# Patient Record
Sex: Male | Born: 1935 | Race: White | Hispanic: No | Marital: Single | Smoking: Former smoker
Health system: Southern US, Community
[De-identification: ages and names within clinical notes are randomized; demographics above are authoritative.]

---

## 2018-08-07 ENCOUNTER — Emergency Department (HOSPITAL_COMMUNITY)
Admission: EM | Admit: 2018-08-07 | Discharge: 2018-08-07 | Disposition: A | Discharge status: Home / Self Care | Payer: Medicare Other | Attending: Emergency Medicine | Admitting: Emergency Medicine

## 2018-08-07 DIAGNOSIS — Z7982 Long term (current) use of aspirin: Secondary | ICD-10-CM | POA: Diagnosis not present

## 2018-08-07 DIAGNOSIS — Z79899 Other long term (current) drug therapy: Secondary | ICD-10-CM | POA: Diagnosis not present

## 2018-08-07 DIAGNOSIS — R040 Epistaxis: Secondary | ICD-10-CM

## 2018-08-07 LAB — CBC
HCT: 41 % (ref 39.0–52.0)
Hemoglobin: 13.1 g/dL (ref 13.0–17.0)
MCH: 31.5 pg (ref 26.0–34.0)
MCHC: 32 g/dL (ref 30.0–36.0)
MCV: 98.6 fL (ref 80.0–100.0)
Platelets: 202 10*3/uL (ref 150–400)
RBC: 4.16 MIL/uL — ABNORMAL LOW (ref 4.22–5.81)
RDW: 13.9 % (ref 11.5–15.5)
WBC: 7.2 10*3/uL (ref 4.0–10.5)
nRBC: 0 % (ref 0.0–0.2)

## 2018-08-07 MED ORDER — OXYMETAZOLINE HCL 0.05 % NA SOLN
1.0000 | Freq: Once | NASAL | Status: AC
  Administered 2018-08-07: 1 via NASAL
  Filled 2018-08-07: qty 15

## 2018-08-07 NOTE — ED Notes (Signed)
Patient verbalizes understanding of discharge instructions. Opportunity for questioning and answers were provided. Armband removed by staff, pt discharged from ED. Ambulated out into lobby  

## 2018-08-07 NOTE — Discharge Instructions (Addendum)
Thank you for allowing me to care for you today in the Emergency Department.   Start using your saline nose spray, 2 sprays in each nostril 2 times daily.  Try to limit blowing your nose or sneezing. Take your cetirizine at home if this helps to decrease sneezing for you.  If you develop another nosebleed, would lean forward, and pinch the nostrils together for at least 10 minutes.  If you continue to have bleeding, insert 1 spray of Afrin into the nostril that is bleeding and then continue to apply pressure for at least 10 minutes.  If you are unable to get the bleeding to stop, return to the emergency department for evaluation, particularly if you develop dizziness, lightheadedness, or if your passing large blood clots.  If you continue to have several episodes of bleeding at home that you are able to get to stop on your own, follow-up with Dr. Shea Evans in the office.

## 2018-08-07 NOTE — ED Provider Notes (Signed)
[facility] Provider Note   CSN: [id number] Arrival date & time: 08/07/18  0501     History   Chief Complaint Chief Complaint  Patient presents with  . Epistaxis    HPI Don Drake is a 85 y.o. male with a history of A. fib on Pradaxa, hypertension, and PE who presents to the emergency department with a chief complaint of epistaxis.  The patient endorses 3 episodes of epistaxis in the last 24 hours. He underwent septoplasty and ITR on by Dr. Donnalee CurryMark Emery on 07/15/18.  Had postoperative follow-up appointment on 07/30/2018 and was clear to follow-up as needed.  He reports the first episode happened yesterday afternoon approximately 1 to 2 hours after taking his afternoon dose of Pradaxa.  He squirted Afrin and placed a cotton ball into the right nostril.  The bleeding stopped shortly thereafter.  He states he developed a second episode last night several hours after taking his nighttime dose of Pradaxa.  States the bleeding stopped before he went to bed.  He reports a third episode that occurred when he awoke this morning.  He attempted to use Afrin, but was unable to get the bleeding to stop.  He denies passing any clots.  The last 2 episodes have been bilateral.  No aggravating or alleviating factors.  No dizziness or lightheadedness.  He reports increased sneezing for 2 days prior to the onset of epistaxis.  States he uses a humidifier in his home.  He reports that he has stopped using the saline nose spray provided to him by ENT.   The history is provided by the patient. No language interpreter was used.    No past medical history on file.  There are no active problems to display for this patient.      Home Medications    Prior to Admission medications   Medication Sig Start Date End Date Taking? Authorizing Provider  aspirin 81 MG chewable tablet Chew 81 mg by mouth every Monday, Wednesday, and Friday.   Yes [provider]  B  Complex Vitamins (VITAMIN B-COMPLEX) TABS Take 1 tablet by mouth daily.   Yes [provider]  Calcium Carbonate-Vitamin D (CALCIUM-D PO) Take 1 tablet by mouth daily.    Yes [provider]  clindamycin (CLEOCIN T) 1 % external solution Apply 1 application topically 2 (two) times daily.  03/08/15  Yes [provider]  clobetasol (TEMOVATE) 0.05 % external solution Apply 1 application topically 2 (two) times daily.  06/04/17  Yes [provider]  Coenzyme Q10-Fish Oil-Vit E (CO-Q 10 OMEGA-3 FISH OIL) CAPS Take 1 capsule by mouth 2 (two) times daily.   Yes [provider]  dabigatran (PRADAXA) 150 MG CAPS capsule Take 150 mg by mouth every 12 (twelve) hours.  02/13/17  Yes [provider]  escitalopram (LEXAPRO) 10 MG tablet Take 5 mg by mouth daily.  04/06/17  Yes [provider]  ipratropium (ATROVENT) 0.03 % nasal spray Place 1-2 sprays into the nose daily.  05/24/18  Yes [provider]  losartan (COZAAR) 25 MG tablet Take 12.5 mg by mouth daily.  01/12/17  Yes [provider]  Multiple Vitamins-Minerals (OCUVITE PO) Take 1 tablet by mouth every other day.   Yes [provider]  prednisoLONE acetate (PRED FORTE) 1 % ophthalmic suspension Place 1 drop into the left eye every morning.  06/10/15  Yes [provider]  traZODone (DESYREL) 50 MG tablet Take 25 mg by mouth  every morning.  02/28/17  Yes [provider]  UNABLE TO FIND Take 1 tablet by mouth daily. NEURO 24   Yes [provider]  vitamin C (ASCORBIC ACID) 500 MG tablet Take 500 mg by mouth daily.    Yes [provider]  Zinc Sulfate (ZINC 15 PO) Take 15 mg by mouth daily.    Yes [provider]    Family History No family history on file.  Social History Social History   Tobacco Use  . Smoking status: Not on file  Substance Use Topics  . Alcohol use: Not on file  . Drug use: Not on file      Allergies   Statins   Review of Systems Review of Systems  Constitutional: Negative for appetite change and fever.  HENT: Positive for nosebleeds and sneezing. Negative for congestion, rhinorrhea, sinus pressure, sinus pain, sore throat and voice change.   Respiratory: Negative for cough, shortness of breath and wheezing.   Cardiovascular: Negative for chest pain.  Gastrointestinal: Negative for abdominal pain, constipation, diarrhea, nausea and vomiting.  Genitourinary: Negative for dysuria, hematuria and urgency.  Musculoskeletal: Negative for back pain, myalgias, neck pain and neck stiffness.  Skin: Negative for color change and rash.  Allergic/Immunologic: Negative for immunocompromised state.  Neurological: Negative for dizziness, syncope, weakness, numbness and headaches.  Psychiatric/Behavioral: Negative for confusion.   Physical Exam Updated Vital Signs BP (!) 152/95   Pulse 67   Temp 97.7 F (36.5 C) (Oral)   Resp 16   SpO2 97%   Physical Exam Vitals signs and nursing note reviewed.  Constitutional:      General: He is not in acute distress.    Appearance: He is well-developed. He is not ill-appearing.  HENT:     Head: Normocephalic.     Right Ear: Tympanic membrane normal.     Nose: No septal deviation or laceration.     Right Nostril: No foreign body, septal hematoma or occlusion.     Left Nostril: No foreign body, septal hematoma or occlusion.     Comments: Minimal amount of dried blood noted in the bilateral nares, over the mucosa of the lateral aspects of the nose.  No active bleeding.  No bleeding near the septum.  Septum is unremarkable.     Mouth/Throat:     Comments: Minimal amount of dried blood noted in the posterior oropharynx. Eyes:     Conjunctiva/sclera: Conjunctivae normal.  Neck:     Musculoskeletal: Normal range of motion and neck supple.  Cardiovascular:     Rate and Rhythm: Normal rate and regular rhythm.     Heart sounds: No murmur.  No friction rub. No gallop.   Pulmonary:     Effort: Pulmonary effort is normal. No respiratory distress.     Breath sounds: Normal breath sounds. No stridor. No wheezing, rhonchi or rales.  Chest:     Chest wall: No tenderness.  Abdominal:     General: There is no distension.     Palpations: Abdomen is soft.  Skin:    General: Skin is warm and dry.  Neurological:     Mental Status: He is alert.  Psychiatric:        Behavior: Behavior normal.      ED Treatments / Results  Labs (all labs ordered are listed, but only abnormal results are displayed) Labs Reviewed  CBC - Abnormal; Notable for the following components:      Result Value   RBC 4.16 (*)  All other components within normal limits    EKG None  Radiology No results found.  Procedures Procedures (including critical care time)  Medications Ordered in ED Medications  oxymetazoline (AFRIN) 0.05 % nasal spray 1 spray (1 spray Each Nare Given 08/07/18 0522)     Initial Impression / Assessment and Plan / ED Course  I have reviewed the triage vital signs and the nursing notes.  Pertinent labs & imaging results that were available during my care of the patient were reviewed by me and considered in my medical decision making (see chart for details).     85 year old male with a history of A. fib on Pradaxa, hypertension, and PE presents to the emergency department with a chief complaint of epistaxis.  He has had 3 episodes of epistaxis prior to arrival in the ED.  The patient was discussed and evaluated by Dr. Clayborne Dana, attending physician.  He was treated with Afrin in the ER and bleeding has subsided.  No repeat bleeding episodes for the last 3 hours during observation.  Hemoglobin is 13.1.  He denies dizziness or lightheadedness.  He reports increased sneezing over the last 2 days prior to the onset of the first episode.  He has also stopped using his saline nose spray.  Encourage the patient to restart saline nose  spray.  Instructions on epistaxis control at home have been given.  Encourage the patient that if he has recurrent episodes of epistaxis that he is able to stop at home that he should follow-up with ENT.  Reports that if he has another episode of bleeding that his he is unable to stop after applying pressure and using Afrin that he should return to the ER.  Strict return precautions given.  He is hemodynamically stable in no acute distress.  He is safe for discharge home with outpatient follow-up at this time.  Final Clinical Impressions(s) / ED Diagnoses   Final diagnoses:  Epistaxis    ED Discharge Orders    None       Barkley Boards, PA-C 08/07/18 1333    Mesner, Barbara Cower, MD 08/08/18 1816

## 2018-08-07 NOTE — ED Triage Notes (Signed)
Pt states he had nasal surgery 2 weeks ago from Monday. Patient woke up with blood pouring from his nose. Pt tried to stop the bleeding with affrin, with no relief. Patient is on a blood thinner.

## 2018-10-22 ENCOUNTER — Encounter: Payer: Self-pay | Admitting: Vascular Surgery

## 2018-10-22 ENCOUNTER — Other Ambulatory Visit: Payer: Self-pay

## 2018-10-22 ENCOUNTER — Ambulatory Visit (INDEPENDENT_AMBULATORY_CARE_PROVIDER_SITE_OTHER): Payer: Medicare Other | Admitting: Vascular Surgery

## 2018-10-22 ENCOUNTER — Ambulatory Visit (HOSPITAL_COMMUNITY)
Admission: RE | Admit: 2018-10-22 | Discharge: 2018-10-22 | Disposition: A | Discharge status: Home / Self Care | Payer: Medicare Other | Source: Ambulatory Visit | Attending: Vascular Surgery | Admitting: Vascular Surgery

## 2018-10-22 VITALS — BP 133/79 | HR 70 | Temp 97.3°F | Resp 20 | Ht 72.0 in | Wt 190.9 lb

## 2018-10-22 DIAGNOSIS — I739 Peripheral vascular disease, unspecified: Secondary | ICD-10-CM | POA: Diagnosis present

## 2018-10-22 NOTE — Progress Notes (Signed)
[facility]ist of [facility]  Patient name: Don Drake MRN: [id number] DOB: 16-Jan-1933 Sex: male  REASON FOR CONSULT: Evaluation lower extremity arterial insufficiency  HPI: Don Drake is a 85 y.o. male, who is here today for evaluation of lower extremity arterial insufficiency.  He is very active 85 year old gentleman who notes discomfort in his calves and feet with exercise.  He does exercise with stationary bike and elliptical equipment 4 days a week.  He did a great deal of sports and running in his earlier years.  He tries to be proactive in his health.  He was found to have no palpable pedal pulses and is seen today for further discussion.  Does have a prior history of coronary intervention with stenting.  Has a history of pacemaker and also has a history of pulmonary embolus in the past.  No history of lower extremity tissue loss or rest pain.  History reviewed. No pertinent past medical history.  History reviewed. No pertinent family history.  SOCIAL HISTORY: Social History   Socioeconomic History  . Marital status: Single    Spouse name: Not on file  . Number of children: Not on file  . Years of education: Not on file  . Highest education level: Not on file  Occupational History  . Not on file  Social Needs  . Financial resource strain: Not on file  . Food insecurity:    Worry: Not on file    Inability: Not on file  . Transportation needs:    Medical: Not on file    Non-medical: Not on file  Tobacco Use  . Smoking status: Former Games developer  . Smokeless tobacco: Never Used  Substance and Sexual Activity  . Alcohol use: Yes    Alcohol/week: 1.0 standard drinks    Types: 1 Glasses of wine per week  . Drug use: Never  . Sexual activity: Not on file  Lifestyle  . Physical activity:    Days per week: Not on file    Minutes per session: Not on file  . Stress: Not on file  Relationships  . Social connections:   Talks on phone: Not on file    Gets together: Not on file    Attends religious service: Not on file    Active member of club or organization: Not on file    Attends meetings of clubs or organizations: Not on file    Relationship status: Not on file  . Intimate partner violence:    Fear of current or ex partner: Not on file    Emotionally abused: Not on file    Physically abused: Not on file    Forced sexual activity: Not on file  Other Topics Concern  . Not on file  Social History Narrative  . Not on file    Allergies  Allergen Reactions  . Statins Other (See Comments)    Other reaction(s): Myalgias (intolerance) Muscle pain      Current Outpatient Medications  Medication Sig Dispense Refill  . aspirin 81 MG chewable tablet Chew 81 mg by mouth every Monday, Wednesday, and Friday.    . B Complex Vitamins (VITAMIN B-COMPLEX) TABS Take 1 tablet by mouth daily.    . Calcium Carbonate-Vitamin D (CALCIUM-D PO) Take 1 tablet by mouth daily.     . clindamycin (CLEOCIN T) 1 % external solution Apply 1 application topically 2 (two) times daily.     . clobetasol (TEMOVATE) 0.05 % external solution Apply 1 application topically 2 (  two) times daily.     . Coenzyme Q10-Fish Oil-Vit E (CO-Q 10 OMEGA-3 FISH OIL) CAPS Take 1 capsule by mouth 2 (two) times daily.    . dabigatran (PRADAXA) 150 MG CAPS capsule Take 150 mg by mouth every 12 (twelve) hours.     Marland Kitchen escitalopram (LEXAPRO) 10 MG tablet Take 5 mg by mouth daily.     Marland Kitchen ipratropium (ATROVENT) 0.03 % nasal spray Place 1-2 sprays into the nose daily.     Marland Kitchen losartan (COZAAR) 25 MG tablet Take 12.5 mg by mouth daily.     . Multiple Vitamins-Minerals (OCUVITE PO) Take 1 tablet by mouth every other day.    . prednisoLONE acetate (PRED FORTE) 1 % ophthalmic suspension Place 1 drop into the left eye every morning.     . traZODone (DESYREL) 50 MG tablet Take 25 mg by mouth every morning.     Marland Kitchen UNABLE TO FIND Take 1 tablet by mouth daily. NEURO 24     . vitamin C (ASCORBIC ACID) 500 MG tablet Take 500 mg by mouth daily.     . Zinc Sulfate (ZINC 15 PO) Take 15 mg by mouth daily.      No current facility-administered medications for this visit.     REVIEW OF SYSTEMS:  [X]  denotes positive finding, [ ]  denotes negative finding Cardiac  Comments:  Chest pain or chest pressure:    Shortness of breath upon exertion: x   Short of breath when lying flat:    Irregular heart rhythm:        Vascular    Pain in calf, thigh, or hip brought on by ambulation: x   Pain in feet at night that wakes you up from your sleep:     Blood clot in your veins:    Leg swelling:  x       Pulmonary    Oxygen at home:    Productive cough:     Wheezing:         Neurologic    Sudden weakness in arms or legs:     Sudden numbness in arms or legs:     Sudden onset of difficulty speaking or slurred speech:    Temporary loss of vision in one eye:     Problems with dizziness:         Gastrointestinal    Blood in stool:     Vomited blood:         Genitourinary    Burning when urinating:     Blood in urine:        Psychiatric    Major depression:         Hematologic    Bleeding problems:    Problems with blood clotting too easily:        Skin    Rashes or ulcers: x       Constitutional    Fever or chills:      PHYSICAL EXAM: Vitals:   10/22/18 1007  BP: 133/79  Pulse: 70  Resp: 20  Temp: (!) 97.3 F (36.3 C)  SpO2: 97%  Weight: 190 lb 14.4 oz (86.6 kg)  Height: 6' (1.829 m)    GENERAL: The patient is a well-nourished male, in no acute distress. The vital signs are documented above. CARDIOVASCULAR: Carotid arteries without bruits bilaterally.  2+ radial pulses bilaterally.  2+ femoral and 2+ popliteal pulses bilaterally.  I do not palpate pedal pulses. PULMONARY: There is good air exchange  ABDOMEN: Soft  and non-tender  MUSCULOSKELETAL: There are no major deformities or cyanosis. NEUROLOGIC: No focal weakness or paresthesias are  detected. SKIN: There are no ulcers or rashes noted. PSYCHIATRIC: The patient has a normal affect.  DATA:  Noninvasive studies reveal ankle arm index of 0.91 on the right and 0.95 on the left with biphasic waveforms  MEDICAL ISSUES: I discussed these findings at length with the patient.  He does have some mild tibial disease but no significant arterial insufficiency.  He underwent an exercise study in our noninvasive lab and had normal response to exercise with an increase in his lower extremity pressures.  I explained that this essentially rules out any significant lower extremity arterial insufficiency.  He was relieved with this discussion.  He has multiple questions regarding nutritional supplements that he should be taking regarding this.  I have deferred this to Dr. Steele Berg explaining that I really do not have any recommendations for this.  He will see [place] again on an as-needed basis.   Larina Earthly, MD FACS [facility] of [facility] Tel [phone] Pager [phone]

## 2019-07-21 ENCOUNTER — Telehealth: Payer: Self-pay | Admitting: *Deleted

## 2019-07-21 NOTE — Telephone Encounter (Signed)
Pt called twice within 22 minutes concerning Formula 3, stating his podiatrist was currently out of it.

## 2019-07-22 NOTE — Telephone Encounter (Signed)
Left message informing Don Drake our office did carry the [place] and he could purchase at the front desk for $36.00 +tax.

## 2019-09-02 ENCOUNTER — Ambulatory Visit (INDEPENDENT_AMBULATORY_CARE_PROVIDER_SITE_OTHER): Payer: Medicare Other | Admitting: Pulmonary Disease

## 2019-09-02 ENCOUNTER — Other Ambulatory Visit: Payer: Self-pay

## 2019-09-02 ENCOUNTER — Encounter: Payer: Self-pay | Admitting: Pulmonary Disease

## 2019-09-02 ENCOUNTER — Telehealth: Payer: Self-pay | Admitting: Pulmonary Disease

## 2019-09-02 VITALS — BP 122/62 | HR 63 | Temp 97.9°F | Ht 72.0 in | Wt 190.0 lb

## 2019-09-02 DIAGNOSIS — R0602 Shortness of breath: Secondary | ICD-10-CM | POA: Diagnosis not present

## 2019-09-02 MED ORDER — ALBUTEROL SULFATE HFA 108 (90 BASE) MCG/ACT IN AERS: 2.0000 | INHALATION_SPRAY | Freq: Four times a day (QID) | RESPIRATORY_TRACT | 3 refills | Status: AC | PRN

## 2019-09-02 NOTE — Telephone Encounter (Signed)
Can we obtain his echocardiogram report from [facility]-had study done 2020  Partial results are available on care everywhere  Need to full result to see what his pulmonary pressures are

## 2019-09-02 NOTE — Patient Instructions (Signed)
Shortness of breath on exertion  We will give a prescription for albuterol to be used prior to activity about 15 to 30 minutes  2 puffs up to 4 times a day as needed  We will get a breathing study This will guide [place] with respect to any other inhaler we may be able to use to help your breathing  Stay active  We will see you back in the office in 6 weeks  Call with any concerns

## 2019-09-02 NOTE — Progress Notes (Signed)
Subjective:    Patient ID: Don Drake, male    DOB: 02/01/1933, 86 y.o.   MRN: [id number]  Patient being seen for shortness of breath  Usually only experiences shortness of breath with significant exertion He manages to workout on a regular basis Goes walking on a regular basis Used to run-has not been able to do this in the last couple years  Has a history of atrial fibrillation Previous history of pulmonary embolism about 2 to 3 years ago History of cardiomyopathy  He had a recent CT scan of the chest showing evidence of emphysema, bronchiectasis, some scarring and multiple nodules  Smoked in the remote past socially  He was told about having emphysema following the CT scanner  Social alcohol use  Medical history significant for stage III chronic kidney disease Emphysema Hyperlipidemia Dilated cardiomyopathy that History of PE  Social smoker in the remote past  No contributing family history  Review of Systems  Constitutional: Negative for fever and unexpected weight change.  HENT: Negative for congestion, dental problem, ear pain, nosebleeds, postnasal drip, rhinorrhea, sinus pressure, sneezing, sore throat and trouble swallowing.   Eyes: Negative for redness and itching.  Respiratory: Positive for shortness of breath. Negative for cough, chest tightness and wheezing.   Cardiovascular: Negative for palpitations and leg swelling.  Gastrointestinal: Negative for nausea and vomiting.  Genitourinary: Negative for dysuria.  Musculoskeletal: Negative for joint swelling.  Skin: Negative for rash.  Allergic/Immunologic: Negative.  Negative for environmental allergies, food allergies and immunocompromised state.  Neurological: Negative for headaches.  Hematological: Does not bruise/bleed easily.  Psychiatric/Behavioral: Positive for dysphoric mood. The patient is nervous/anxious.        Objective:   Physical Exam Constitutional:      Appearance: Normal appearance.    HENT:     Head: Normocephalic and atraumatic.     Mouth/Throat:     Mouth: Mucous membranes are moist.  Eyes:     Pupils: Pupils are equal, round, and reactive to light.  Cardiovascular:     Rate and Rhythm: Normal rate and regular rhythm.     Pulses: Normal pulses.     Heart sounds: Normal heart sounds. No murmur. No friction rub.  Pulmonary:     Effort: Pulmonary effort is normal. No respiratory distress.     Breath sounds: Normal breath sounds. No stridor. No wheezing or rhonchi.  Musculoskeletal:        General: No swelling or tenderness. Normal range of motion.     Cervical back: Normal range of motion and neck supple.  Skin:    General: Skin is warm and dry.     Coloration: Skin is not jaundiced.  Neurological:     General: No focal deficit present.     Mental Status: He is alert.  Psychiatric:        Mood and Affect: Mood normal.        Behavior: Behavior normal.   Records from care everywhere reviewed Family medicine office visit 08/06/2019  CT scan result from 08/14/2019 reviewed showing evidence of bronchiectasis, emphysema, multiple nodules, scarring  Most recent echocardiogram did not have a report of his pulmonary pressures however one from 2018 did report moderate pulmonary hypertension    Assessment & Plan:  .  Shortness of breath with exertion .  Obstructive lung disease  .  History of cardiomyopathy  .  History of pulmonary embolism  .  Pulmonary hypertension  Plan .  Will give him a prescription for albuterol  to be used prior to exertion  .  Obtain a pulmonary function test  .  Obtain the echocardiogram report performed recently-query worsening of his pulmonary hypertension .  Pulmonary hypertension likely related to chronic lung and heart disease  .  We will follow-up in about 6 to 8 weeks  Course to call with any significant concerns

## 2019-09-03 NOTE — Telephone Encounter (Signed)
Called [facility] Dr. [address] office [phone] and spoke with medical records. She is faxing the full report over to our office now.   Nothing further needed will put in AO mail box when received.

## 2019-09-03 NOTE — Telephone Encounter (Signed)
Ty, aware 

## 2019-09-16 ENCOUNTER — Telehealth: Payer: Self-pay | Admitting: Pulmonary Disease

## 2019-09-16 NOTE — Telephone Encounter (Signed)
Echocardiogram result reviewed showing moderately dilated right atrium Estimated pulmonary pressure moderately elevated-actual pressures not on statement. Left ventricle ejection fraction of 40 to 50% with global hypokinesis, diastolic function not well visualized

## 2019-10-25 ENCOUNTER — Inpatient Hospital Stay (HOSPITAL_COMMUNITY): Admission: RE | Admit: 2019-10-25 | Payer: Medicare Other | Source: Ambulatory Visit

## 2019-10-27 ENCOUNTER — Telehealth: Payer: Self-pay | Admitting: Pulmonary Disease

## 2019-10-27 NOTE — Telephone Encounter (Signed)
lmtcb X1 for pt. Per [facility] policy pt must have a negative PCR Covid-19 test prior to any PFTs performed, regardless of immunization status.    Pt's 3/24 pft must be rescheduled, and pt must get covid tested.

## 2019-10-28 NOTE — Telephone Encounter (Signed)
Pt has been rescheduled for his PFT and COVID test for June. Nothing further needed at this time. Will sign off.

## 2019-10-29 ENCOUNTER — Ambulatory Visit: Payer: Medicare Other | Admitting: Primary Care

## 2019-10-31 ENCOUNTER — Other Ambulatory Visit (HOSPITAL_COMMUNITY): Payer: Medicare Other

## 2020-01-19 ENCOUNTER — Other Ambulatory Visit (HOSPITAL_COMMUNITY)
Admission: RE | Admit: 2020-01-19 | Discharge: 2020-01-19 | Disposition: A | Discharge status: Home / Self Care | Payer: Medicare Other | Source: Ambulatory Visit | Attending: Pulmonary Disease | Admitting: Pulmonary Disease

## 2020-01-19 DIAGNOSIS — Z01812 Encounter for preprocedural laboratory examination: Secondary | ICD-10-CM | POA: Diagnosis present

## 2020-01-19 DIAGNOSIS — Z20822 Contact with and (suspected) exposure to covid-19: Secondary | ICD-10-CM | POA: Diagnosis not present

## 2020-01-19 LAB — SARS CORONAVIRUS 2 (TAT 6-24 HRS): SARS Coronavirus 2: NEGATIVE

## 2020-01-22 ENCOUNTER — Ambulatory Visit (INDEPENDENT_AMBULATORY_CARE_PROVIDER_SITE_OTHER): Payer: Medicare Other | Admitting: Pulmonary Disease

## 2020-01-22 ENCOUNTER — Other Ambulatory Visit: Payer: Self-pay

## 2020-01-22 DIAGNOSIS — R0602 Shortness of breath: Secondary | ICD-10-CM

## 2020-01-22 LAB — PULMONARY FUNCTION TEST
DL/VA % pred: 110 %
DL/VA: 4.18 ml/min/mmHg/L
DLCO cor % pred: 97 %
DLCO cor: 24.57 ml/min/mmHg
DLCO unc % pred: 97 %
DLCO unc: 24.57 ml/min/mmHg
FEF 25-75 Post: 1.75 L/sec
FEF 25-75 Pre: 1.38 L/sec
FEF2575-%Change-Post: 26 %
FEF2575-%Pred-Post: 91 %
FEF2575-%Pred-Pre: 72 %
FEV1-%Change-Post: 5 %
FEV1-%Pred-Post: 87 %
FEV1-%Pred-Pre: 82 %
FEV1-Post: 2.56 L
FEV1-Pre: 2.42 L
FEV1FVC-%Change-Post: 3 %
FEV1FVC-%Pred-Pre: 98 %
FEV6-%Change-Post: 3 %
FEV6-%Pred-Post: 91 %
FEV6-%Pred-Pre: 88 %
FEV6-Post: 3.53 L
FEV6-Pre: 3.41 L
FEV6FVC-%Change-Post: 0 %
FEV6FVC-%Pred-Post: 106 %
FEV6FVC-%Pred-Pre: 105 %
FVC-%Change-Post: 2 %
FVC-%Pred-Post: 85 %
FVC-%Pred-Pre: 83 %
FVC-Post: 3.57 L
FVC-Pre: 3.48 L
Post FEV1/FVC ratio: 72 %
Post FEV6/FVC ratio: 99 %
Pre FEV1/FVC ratio: 69 %
Pre FEV6/FVC Ratio: 98 %
RV % pred: 130 %
RV: 3.75 L
TLC % pred: 97 %
TLC: 7.29 L

## 2020-01-22 NOTE — Progress Notes (Signed)
PFT done today. 

## 2020-02-02 ENCOUNTER — Ambulatory Visit: Payer: Medicare Other | Admitting: Primary Care

## 2020-02-26 ENCOUNTER — Encounter: Payer: Self-pay | Admitting: Pulmonary Disease

## 2020-02-26 ENCOUNTER — Other Ambulatory Visit: Payer: Self-pay

## 2020-02-26 ENCOUNTER — Ambulatory Visit (INDEPENDENT_AMBULATORY_CARE_PROVIDER_SITE_OTHER): Payer: Medicare Other | Admitting: Pulmonary Disease

## 2020-02-26 ENCOUNTER — Ambulatory Visit (INDEPENDENT_AMBULATORY_CARE_PROVIDER_SITE_OTHER): Payer: Medicare Other

## 2020-02-26 VITALS — BP 122/78 | HR 88 | Temp 97.7°F | Ht 71.0 in | Wt 201.2 lb

## 2020-02-26 DIAGNOSIS — R0602 Shortness of breath: Secondary | ICD-10-CM

## 2020-02-26 LAB — COMPREHENSIVE METABOLIC PANEL
ALT: 24 U/L (ref 0–53)
AST: 29 U/L (ref 0–37)
Albumin: 4 g/dL (ref 3.5–5.2)
Alkaline Phosphatase: 173 U/L — ABNORMAL HIGH (ref 39–117)
BUN: 25 mg/dL — ABNORMAL HIGH (ref 6–23)
CO2: 26 mEq/L (ref 19–32)
Calcium: 9.4 mg/dL (ref 8.4–10.5)
Chloride: 107 mEq/L (ref 96–112)
Creatinine, Ser: 1.18 mg/dL (ref 0.40–1.50)
GFR: 58.62 mL/min — ABNORMAL LOW (ref >60.00)
Glucose, Bld: 99 mg/dL (ref 70–99)
Potassium: 4.1 mEq/L (ref 3.5–5.1)
Sodium: 137 mEq/L (ref 135–145)
Total Bilirubin: 1.2 mg/dL (ref 0.2–1.2)
Total Protein: 7.2 g/dL (ref 6.0–8.3)

## 2020-02-26 LAB — CBC
HCT: 39.6 % (ref 39.0–52.0)
Hemoglobin: 13.2 g/dL (ref 13.0–17.0)
MCHC: 33.4 g/dL (ref 30.0–36.0)
MCV: 95.2 fl (ref 78.0–100.0)
Platelets: 138 10*3/uL — ABNORMAL LOW (ref 150.0–400.0)
RBC: 4.16 Mil/uL — ABNORMAL LOW (ref 4.22–5.81)
RDW: 14.6 % (ref 11.5–15.5)
WBC: 4.2 10*3/uL (ref 4.0–10.5)

## 2020-02-26 MED ORDER — SPIRIVA RESPIMAT 2.5 MCG/ACT IN AERS: 2.0000 | INHALATION_SPRAY | Freq: Every day | RESPIRATORY_TRACT | 0 refills | Status: AC

## 2020-02-26 NOTE — Progress Notes (Addendum)
@Patient  ID: , male    DOB: [birth year], 86 y.o.   MRN: [id number]  Chief Complaint  Patient presents with   Follow-up    pt wants to go over results of pft    Referring provider: [id number], MD  HPI:  86 year old male former smoker initially referred to our office in January/2021 for evaluation of shortness of breath.  Patient also carries a past medical history of a history of a pulmonary embolism, pulmonary hypertension, obstructive lung disease  PMH: cardiomyopathy Smoker/ Smoking History: Former smoker Maintenance:  None Pt of: Dr. February/2021  02/26/2020  - Visit   86 year old male former smoker followed in our office by Dr. 83.  Patient recently completed pulmonary function testing he is requesting those results today.  Patient was last seen in our office in January/2021 for evaluation of shortness of breath.  At that point in time we recommended albuterol as well as obtaining pulmonary function testing and an echocardiogram and we wanted to have the patient have a follow-up in 6 to 8 weeks.  01/22/2020-pulmonary function test-FVC 3.48 (83% predicted), postbronchodilator ratio 72, postbronchodilator FEV1 2.56 (87% predicted), no bronchodilator response, DLCO 24.57 (97% predicted)  Patient reporting to our office today that he has been using his rescue inhaler 1 time a day.  He is unsure if this is actually helping.  Patient is having ongoing issues with achieving appropriate diuresis.  He has lower extremity swelling on exam today.  This is followed by his cardiologist at [facility].  April/2021 CTA chest shows pulmonary vascular congestion as well as suspected pulmonary hypertension.  Patient also with known mitral valve regurgitation.  Patient has been dealing with elevated BNP levels for many months.  He reports that he recently stopped his Lasix and primary care is put him on torsemide.  He denies being on spironolactone or metolazone.  He is requesting that after the visit I  contact his daughter who is a May/2021 to update her as she is concerned about his breathing.   Questionaires / Pulmonary Flowsheets:   ACT:  No flowsheet data found.  MMRC: No flowsheet data found.  Epworth:  No flowsheet data found.  Tests:   01/22/2020-pulmonary function test-FVC 3.48 (83% predicted), postbronchodilator ratio 72, postbronchodilator FEV1 2.56 (87% predicted), no bronchodilator response, DLCO 24.57 (97% predicted)  FENO:  No results found for: NITRICOXIDE  PFT: PFT Results Latest Ref Rng & Units 01/22/2020  FVC-Pre L 3.48  FVC-Predicted Pre % 83  FVC-Post L 3.57  FVC-Predicted Post % 85  Pre FEV1/FVC % % 69  Post FEV1/FCV % % 72  FEV1-Pre L 2.42  FEV1-Predicted Pre % 82  FEV1-Post L 2.56  DLCO UNC% % 97  DLCO COR %Predicted % 110  TLC L 7.29  TLC % Predicted % 97  RV % Predicted % 130    WALK:  No flowsheet data found.  Imaging: DG Chest 2 View  Result Date: 02/26/2020 CLINICAL DATA:  Dyspnea on exertion. EXAM: CHEST - 2 VIEW COMPARISON:  Prior chest x-ray report 03/12/2001. FINDINGS: Cardiac pacer with lead tips in right atrium and right ventricle noted. Cardiomegaly. No pulmonary venous congestion. Low lung volumes with mild bibasilar atelectasis. Mild bilateral pleural thickening noted. This is possibly secondary to scarring. No pleural effusion or pneumothorax. No acute bony abnormality. IMPRESSION: Cardiac pacer with lead tips in right atrium right ventricle. Cardiomegaly. No pulmonary venous congestion. Low lung volumes with mild bibasilar atelectasis. Mild bilateral pleural thickening, possibly secondary to scarring  noted. Electronically Signed   By: Maisie Fus  Register   On: 02/26/2020 12:40    Lab Results:  CBC    Component Value Date/Time   WBC 7.2 08/07/2018 0851   RBC 4.16 (L) 08/07/2018 0851   HGB 13.1 08/07/2018 0851   HCT 41.0 08/07/2018 0851   PLT 202 08/07/2018 0851   MCV 98.6 08/07/2018 0851   MCH 31.5 08/07/2018 0851    MCHC 32.0 08/07/2018 0851   RDW 13.9 08/07/2018 0851    BMET No results found for: NA, K, CL, CO2, GLUCOSE, BUN, CREATININE, CALCIUM, GFRNONAA, GFRAA  BNP No results found for: BNP  ProBNP No results found for: PROBNP  Specialty Problems      Pulmonary Problems   SOB (shortness of breath)      Allergies  Allergen Reactions   Statins Other (See Comments)    Other reaction(s): Myalgias (intolerance) Muscle pain      Immunization History  Administered Date(s) Administered   Influenza, High Dose Seasonal PF 05/26/2015, 06/08/2019   PFIZER SARS-COV-2 Vaccination 10/31/2019, 12/01/2019    History reviewed. No pertinent past medical history.  Tobacco History: Social History   Tobacco Use  Smoking Status Former Smoker   Packs/day: 0.30   Years: 1.00   Pack years: 0.30   Quit date: 08/08/1951   Years since quitting: 68.6  Smokeless Tobacco Never Used   Counseling given: Not Answered   Continue to not smoke  Outpatient Encounter Medications as of 02/26/2020  Medication Sig   albuterol (VENTOLIN HFA) 108 (90 Base) MCG/ACT inhaler Inhale 2 puffs into the lungs every 6 (six) hours as needed for wheezing or shortness of breath.   aspirin 81 MG chewable tablet Chew 81 mg by mouth every Monday, Wednesday, and Friday.   B Complex Vitamins (VITAMIN B-COMPLEX) TABS Take 1 tablet by mouth daily.   Bioflavonoid Products (GRAPE SEED PO) Take by mouth.   Calcium Carbonate-Vitamin D (CALCIUM-D PO) Take 1 tablet by mouth daily.    clindamycin (CLEOCIN T) 1 % external solution Apply 1 application topically 2 (two) times daily.    clobetasol (TEMOVATE) 0.05 % external solution Apply 1 application topically 2 (two) times daily.    Coenzyme Q10-Fish Oil-Vit E (CO-Q 10 OMEGA-3 FISH OIL) CAPS Take 1 capsule by mouth 2 (two) times daily.   dabigatran (PRADAXA) 150 MG CAPS capsule Take 150 mg by mouth every 12 (twelve) hours.    escitalopram (LEXAPRO) 10 MG tablet  Take 5 mg by mouth daily.    ipratropium (ATROVENT) 0.03 % nasal spray Place 1-2 sprays into the nose daily.    losartan (COZAAR) 25 MG tablet Take 12.5 mg by mouth daily.    Multiple Vitamins-Minerals (OCUVITE PO) Take 1 tablet by mouth every other day.   prednisoLONE acetate (PRED FORTE) 1 % ophthalmic suspension Place 1 drop into the left eye every morning.    traZODone (DESYREL) 50 MG tablet Take 25 mg by mouth every morning.    Turmeric (QC TUMERIC COMPLEX PO) Take by mouth daily.   UNABLE TO FIND Take 1 tablet by mouth daily. NEURO 24   vitamin C (ASCORBIC ACID) 500 MG tablet Take 500 mg by mouth daily.    Zinc Sulfate (ZINC 15 PO) Take 15 mg by mouth daily.    Tiotropium Bromide Monohydrate (SPIRIVA RESPIMAT) 2.5 MCG/ACT AERS Inhale 2 puffs into the lungs daily.   No facility-administered encounter medications on file as of 02/26/2020.     Review of Systems  Review of  Systems  Constitutional: Positive for fatigue. Negative for activity change, chills, fever and unexpected weight change.  HENT: Positive for congestion. Negative for postnasal drip, rhinorrhea, sinus pressure, sinus pain and sore throat.   Eyes: Negative.   Respiratory: Positive for shortness of breath. Negative for cough and wheezing.   Cardiovascular: Positive for leg swelling. Negative for chest pain and palpitations.  Gastrointestinal: Negative for constipation, diarrhea, nausea and vomiting.  Endocrine: Negative.   Genitourinary: Negative.   Musculoskeletal: Negative.   Skin: Negative.   Neurological: Negative for dizziness and headaches.  Psychiatric/Behavioral: Negative.  Negative for dysphoric mood. The patient is not nervous/anxious.   All other systems reviewed and are negative.    Physical Exam  BP 122/78 (BP Location: Left Arm, Cuff Size: Normal)    Pulse 88    Temp 97.7 F (36.5 C) (Oral)    Ht 5\' 11"  (1.803 m)    Wt 201 lb 3.2 oz (91.3 kg)    SpO2 95%    BMI 28.06 kg/m   Wt Readings  from Last 5 Encounters:  02/26/20 201 lb 3.2 oz (91.3 kg)  09/02/19 190 lb (86.2 kg)  10/22/18 190 lb 14.4 oz (86.6 kg)    BMI Readings from Last 5 Encounters:  02/26/20 28.06 kg/m  09/02/19 25.77 kg/m  10/22/18 25.89 kg/m     Physical Exam Vitals and nursing note reviewed.  Constitutional:      General: He is not in acute distress.    Appearance: Normal appearance. He is normal weight.  HENT:     Head: Normocephalic and atraumatic.     Right Ear: Hearing and external ear normal.     Left Ear: Hearing and external ear normal.     Nose: Nose normal. No mucosal edema or rhinorrhea.     Right Turbinates: Not enlarged.     Left Turbinates: Not enlarged.     Mouth/Throat:     Mouth: Mucous membranes are dry.     Pharynx: Oropharynx is clear. No oropharyngeal exudate.  Eyes:     Pupils: Pupils are equal, round, and reactive to light.  Cardiovascular:     Rate and Rhythm: Normal rate. Rhythm regularly irregular.     Pulses: Normal pulses.     Heart sounds: Normal heart sounds. No murmur heard.   Pulmonary:     Effort: Pulmonary effort is normal.     Breath sounds: Normal breath sounds. No decreased breath sounds, wheezing or rales.  Musculoskeletal:     Cervical back: Normal range of motion.     Right lower leg: Edema (3+ ) present.     Left lower leg: Edema (3+) present.  Lymphadenopathy:     Cervical: No cervical adenopathy.  Skin:    General: Skin is warm and dry.     Capillary Refill: Capillary refill takes less than 2 seconds.     Findings: No erythema or rash.  Neurological:     General: No focal deficit present.     Mental Status: He is alert and oriented to person, place, and time.     Motor: No weakness.     Coordination: Coordination normal.     Gait: Gait is intact. Gait normal.  Psychiatric:        Mood and Affect: Mood normal.        Behavior: Behavior normal. Behavior is cooperative.        Thought Content: Thought content normal.        Judgment:  Judgment normal.  Assessment & Plan:   Discussion: I contacted patient's daughter at his request.  I reviewed the case with her.  I explained the patient appears fluid overloaded.  We will obtain lab work today to see how significantly elevated his BNP is.  Would recommend he continues his torsemide as managed by primary care/cardiology.  If BNP remains elevated and kidney function is stable could consider adding Zaroxolyn to help with active diuresis.  CTA chest does show potential signs of pulmonary hypertension.  Reviewed with patient's daughter that likely patient would be classified as WHO group 2 given his cardiovascular disease.  This does not require or warrant vasodilator treatments.  He does require though aggressive diuresis.  Further evaluation could be done by cardiology especially could consider right heart cath if patient is agreeable to further evaluate his ongoing dyspnea.  I do not believe there is significant pulmonary disease going on that is contributing to this.  We will obtain lab work and a chest x-ray to further evaluate but likely patient will need to have closer follow-up with primary care as well as cardiology following the lab work to more appropriately diurese as well as monitor kidney functioning.  Patient and daughter were both agreeable to the plan.  SOB (shortness of breath) History of elevated BNP Suspected pulmonary hypertension on CTA chest in April/2021 Stable echocardiogram in November/2020 At last office visit Dr. Wynona Neatlalere wanted to obtain a new echocardiogram Patient still struggling with dyspnea on exertion Stable pulmonary function test, reviewed with pt and dtr Patient with 2-3+ lower extremity swelling bilaterally  Plan: Lab work today Chest x-ray today Therapeutic trial of LAMA inhaler, I doubt this will be clinically significant but patient was extremely interested in trying an inhaler, we will try a LAMA inhaler for 2 weeks, if the patient  sees clinical benefit then we can consider a prescription If lab work is elevated would recommend following back up with primary care/cardiology and getting follow-up echocardiogram and could consider right heart cath to further evaluate pulmonary hypertension suspected diagnosis     Return in about 6 months (around 08/28/2020), or if symptoms worsen or fail to improve, for Follow up with Dr. Wynona Neatlalere.   Coral CeoBrian P Jacquelynn Friend, NP 02/26/2020   This appointment required 52 minutes of patient care (this includes precharting, chart review, review of results, face-to-face care, etc.).

## 2020-02-26 NOTE — Assessment & Plan Note (Addendum)
History of elevated BNP Suspected pulmonary hypertension on CTA chest in April/2021 Stable echocardiogram in November/2020 At last office visit Dr. Wynona Neat wanted to obtain a new echocardiogram Patient still struggling with dyspnea on exertion Stable pulmonary function test, reviewed with pt and dtr Patient with 2-3+ lower extremity swelling bilaterally  Plan: Lab work today Chest x-ray today Therapeutic trial of LAMA inhaler, I doubt this will be clinically significant but patient was extremely interested in trying an inhaler, we will try a LAMA inhaler for 2 weeks, if the patient sees clinical benefit then we can consider a prescription If lab work is elevated would recommend following back up with primary care/cardiology and getting follow-up echocardiogram and could consider right heart cath to further evaluate pulmonary hypertension suspected diagnosis

## 2020-02-26 NOTE — Patient Instructions (Addendum)
You were seen today by Lauraine Rinne, NP  for:   It was nice seeing you in clinic today.  I believe your shortness of breath is directly related to fluid retention.  This was seen on your April/2021 CT chest at [facility] that you had fluid congestion.  You have lower leg swelling. I believe this is due to heart your heart not pumping properly.  We will do lab work today to check this.  If lab work is elevated we will recommend that you follow back up with your cardiologist promptly  Take care and stay safe,  Don Drake  1. SOB (shortness of breath)  - Pro b natriuretic peptide (BNP); Future - Comp Met (CMET); Future - CBC; Future  Chest Xray today   We can trial you on an inhaler those instructions are listed below:  Spiriva Respimat 2.5 >>> 2 puffs daily >>> Do this every day >>>This is not a rescue inhaler  If you like this inhaler please contact our office and we can send in a prescription  I will also call your daughter and update her  We will follow up with you after you receive your lab results as well as your chest x-ray results  Please start wearing compression stockings, do not exceed more than 16 hours a day of wearing these  We recommend today:  Orders Placed This Encounter  Procedures  . Pro b natriuretic peptide (BNP)    Standing Status:   Future    Standing Expiration Date:   02/25/2021  . Comp Met (CMET)    Standing Status:   Future    Standing Expiration Date:   02/25/2021  . CBC    Standing Status:   Future    Standing Expiration Date:   02/25/2021   Orders Placed This Encounter  Procedures  . Pro b natriuretic peptide (BNP)  . Comp Met (CMET)  . CBC   No orders of the defined types were placed in this encounter.   Follow Up:    No follow-ups on file.   Please do your part to reduce the spread of COVID-19:      Reduce your risk of any infection  and COVID19 by using the similar precautions used for avoiding the common cold or flu:  Marland Kitchen Wash your  hands often with soap and warm water for at least 20 seconds.  If soap and water are not readily available, use an alcohol-based hand sanitizer with at least 60% alcohol.  . If coughing or sneezing, cover your mouth and nose by coughing or sneezing into the elbow areas of your shirt or coat, into a tissue or into your sleeve (not your hands). Langley Gauss A MASK when in public  . Avoid shaking hands with others and consider head nods or verbal greetings only. . Avoid touching your eyes, nose, or mouth with unwashed hands.  . Avoid close contact with people who are sick. . Avoid places or events with large numbers of people in one location, like concerts or sporting events. . If you have some symptoms but not all symptoms, continue to monitor at home and seek medical attention if your symptoms worsen. . If you are having a medical emergency, call 911.   [place] / e-Visit: [email or web address]         [facility]: [facility]: [phone]  [facility]: [phone]     It is flu season:   >>> Best ways to protect herself from the flu: Receive the yearly flu vaccine, practice good hand hygiene washing with soap and also using hand sanitizer when available, eat a nutritious meals, get adequate rest, hydrate appropriately   Please contact the office if your symptoms worsen or you have concerns that you are not improving.   Thank you for choosing [facility] for your healthcare, and for allowing [place] to partner with you on your healthcare journey. I am thankful to be able to provide care to you today.   Wyn Quaker FNP-C

## 2020-02-27 LAB — PRO B NATRIURETIC PEPTIDE: NT-Pro BNP: 3922 pg/mL — ABNORMAL HIGH (ref 0–486)

## 2021-06-15 IMAGING — DX DG CHEST 2V
2 series · 2 of 2 positions shown · non-contrast
Comparison: Prior chest x-ray report 03/12/2001.

CLINICAL DATA: Dyspnea on exertion.

EXAM:
CHEST - 2 VIEW

[chest pa]
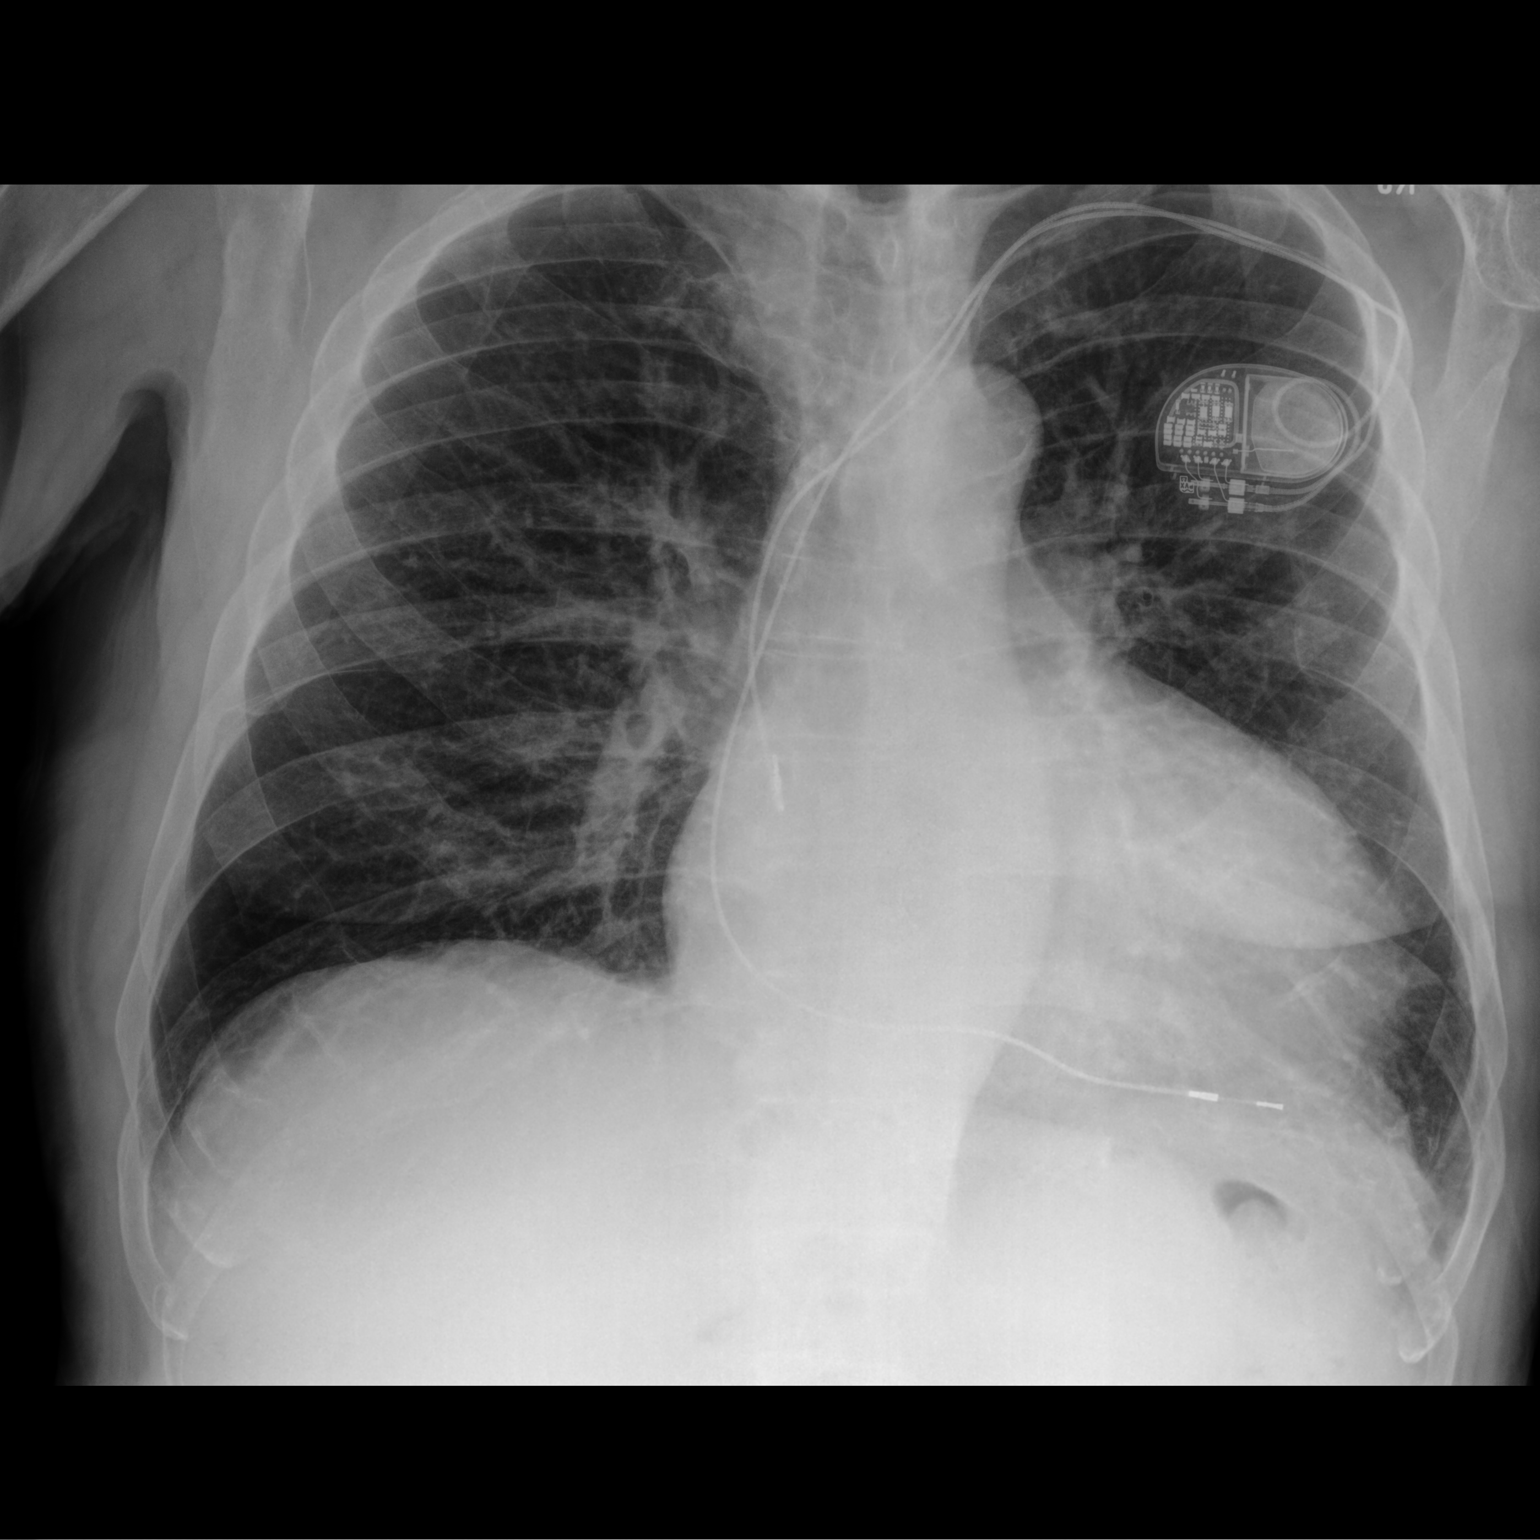

[chest lat]
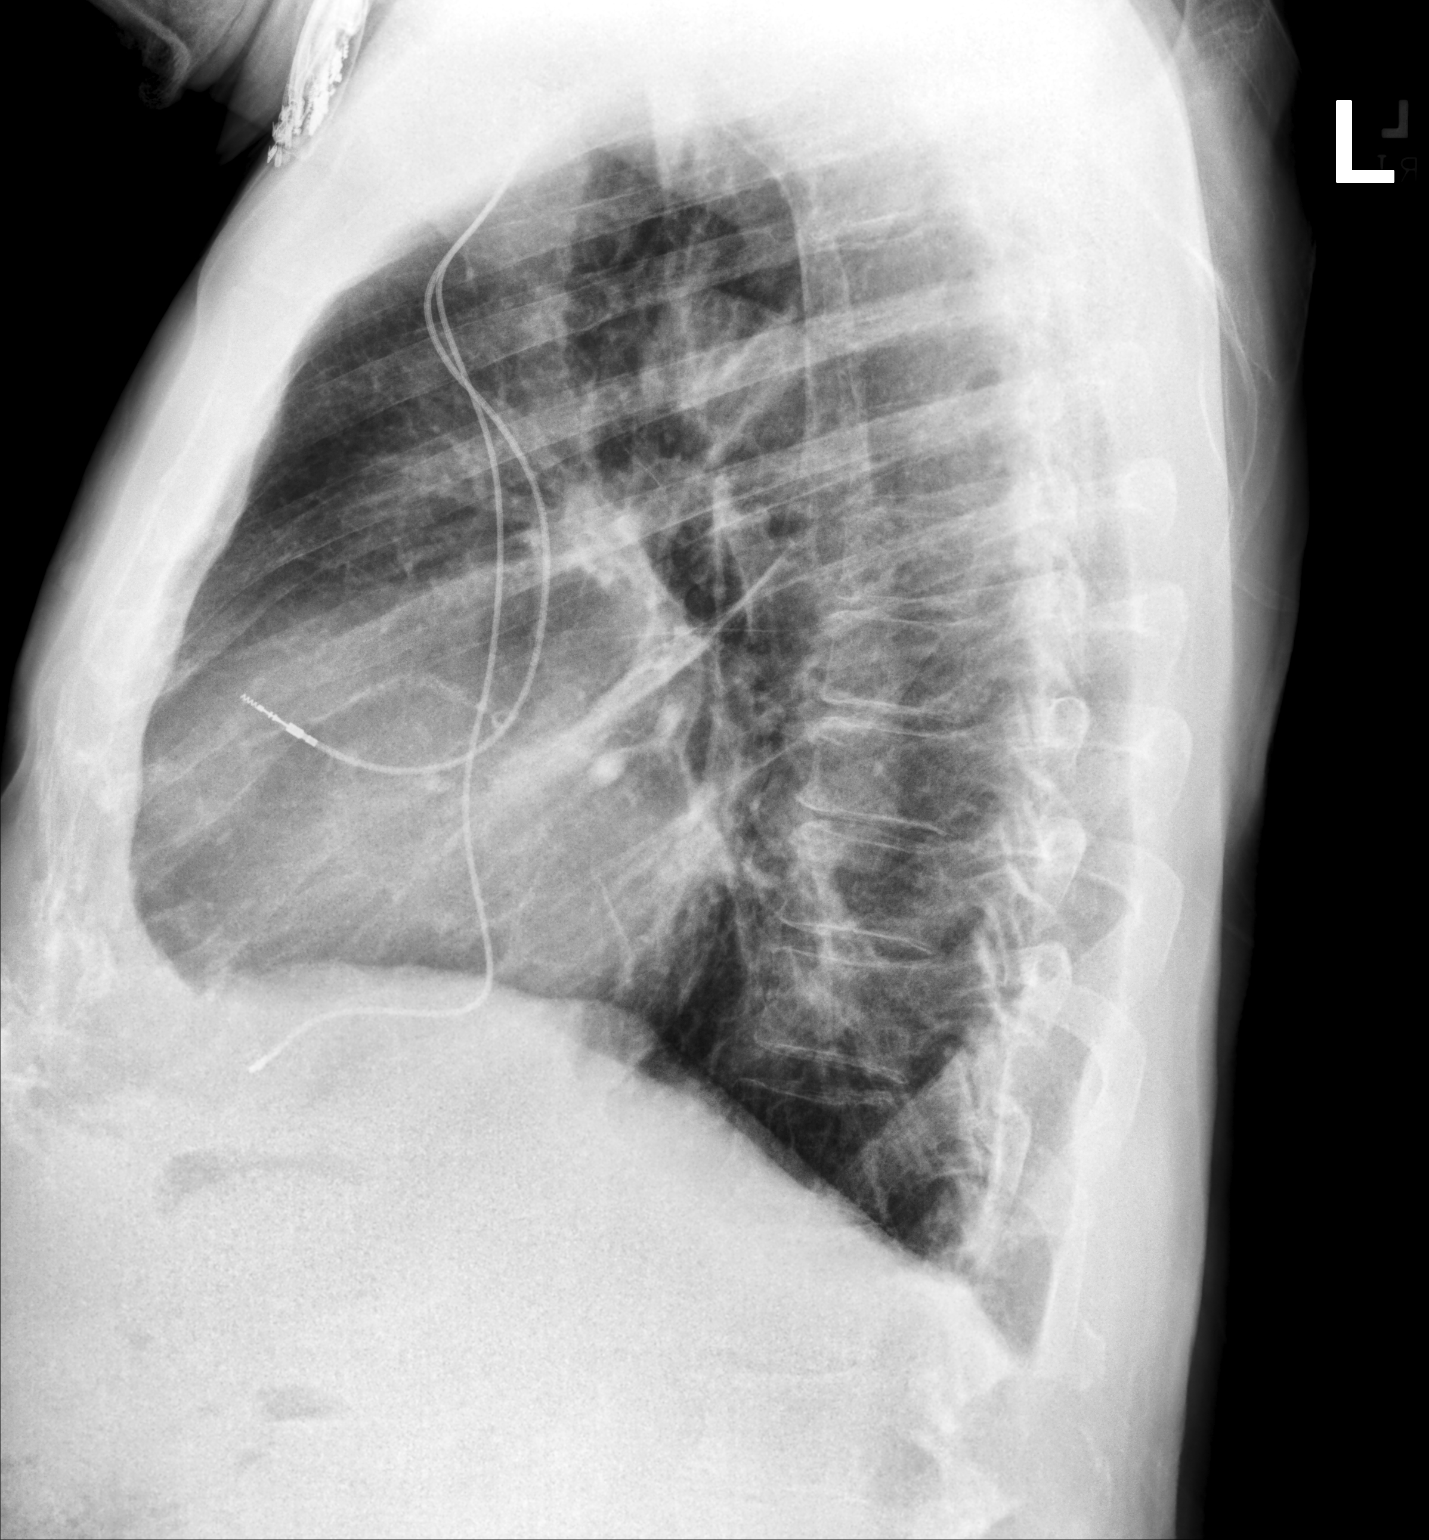

[2 of 2 positions shown; findings below may reference images not displayed]

FINDINGS: Cardiac pacer with lead tips in right atrium and right ventricle
noted. Cardiomegaly. No pulmonary venous congestion. Low lung
volumes with mild bibasilar atelectasis. Mild bilateral pleural
thickening noted. This is possibly secondary to scarring. No pleural
effusion or pneumothorax. No acute bony abnormality.
IMPRESSION: Cardiac pacer with lead tips in right atrium right ventricle.
Cardiomegaly. No pulmonary venous congestion. Low lung volumes with
mild bibasilar atelectasis. Mild bilateral pleural thickening,
possibly secondary to scarring noted.
